# Patient Record
Sex: Female | Born: 1965 | Race: Black or African American | Hispanic: No | Smoking: Never smoker
Health system: Southern US, Community
[De-identification: ages and names within clinical notes are randomized; demographics above are authoritative.]

## PROBLEM LIST (undated history)

## (undated) HISTORY — PX: APPENDECTOMY: SHX54

## (undated) HISTORY — PX: TUBAL LIGATION: SHX77

## (undated) HISTORY — PX: RECTAL PROLAPSE REPAIR: SHX759

## (undated) HISTORY — PX: BACK SURGERY: SHX140

## (undated) HISTORY — PX: ABDOMINAL HYSTERECTOMY: SHX81

---

## 2013-05-28 ENCOUNTER — Emergency Department (HOSPITAL_BASED_OUTPATIENT_CLINIC_OR_DEPARTMENT_OTHER): Payer: Medicaid Other

## 2013-05-28 ENCOUNTER — Encounter (HOSPITAL_BASED_OUTPATIENT_CLINIC_OR_DEPARTMENT_OTHER): Payer: Self-pay | Admitting: Emergency Medicine

## 2013-05-28 ENCOUNTER — Emergency Department (HOSPITAL_BASED_OUTPATIENT_CLINIC_OR_DEPARTMENT_OTHER)
Admission: EM | Admit: 2013-05-28 | Discharge: 2013-05-29 | Disposition: A | Discharge status: Home / Self Care | Payer: Medicaid Other | Attending: Emergency Medicine | Admitting: Emergency Medicine

## 2013-05-28 DIAGNOSIS — S93409A Sprain of unspecified ligament of unspecified ankle, initial encounter: Secondary | ICD-10-CM | POA: Diagnosis not present

## 2013-05-28 DIAGNOSIS — Y92838 Other recreation area as the place of occurrence of the external cause: Secondary | ICD-10-CM | POA: Diagnosis not present

## 2013-05-28 DIAGNOSIS — S93609A Unspecified sprain of unspecified foot, initial encounter: Secondary | ICD-10-CM | POA: Diagnosis not present

## 2013-05-28 DIAGNOSIS — IMO0002 Reserved for concepts with insufficient information to code with codable children: Secondary | ICD-10-CM | POA: Insufficient documentation

## 2013-05-28 DIAGNOSIS — S93402A Sprain of unspecified ligament of left ankle, initial encounter: Secondary | ICD-10-CM

## 2013-05-28 DIAGNOSIS — Y9239 Other specified sports and athletic area as the place of occurrence of the external cause: Secondary | ICD-10-CM | POA: Diagnosis not present

## 2013-05-28 DIAGNOSIS — W010XXA Fall on same level from slipping, tripping and stumbling without subsequent striking against object, initial encounter: Secondary | ICD-10-CM | POA: Insufficient documentation

## 2013-05-28 DIAGNOSIS — S99919A Unspecified injury of unspecified ankle, initial encounter: Secondary | ICD-10-CM | POA: Diagnosis present

## 2013-05-28 DIAGNOSIS — S8990XA Unspecified injury of unspecified lower leg, initial encounter: Secondary | ICD-10-CM | POA: Diagnosis present

## 2013-05-28 DIAGNOSIS — S93602A Unspecified sprain of left foot, initial encounter: Secondary | ICD-10-CM

## 2013-05-28 DIAGNOSIS — Y9353 Activity, golf: Secondary | ICD-10-CM | POA: Diagnosis not present

## 2013-05-28 NOTE — ED Notes (Signed)
MD at bedside. 

## 2013-05-28 NOTE — ED Provider Notes (Signed)
CSN: [id number]     Arrival date & time 05/28/13  2043 History   First MD Initiated Contact with Patient 05/28/13 2307    This chart was scribed for Hanley SeamenJohn L Lavern Maslow, MD by Marica OtterNusrat Rahman, ED Scribe. This patient was seen in room MH11/MH11 and the patient's care was started at 11:08 PM.  PCP: No PCP Per Patient  Chief Complaint  Patient presents with  . Ankle Injury   HPI HPI Comments: Traci Bell is a 48 y.o. female who presents to the Emergency Department complaining of a fall tonight while playing miniature golf. Pt complains of associated pain in, and swelling of, the lateral aspect of her left foot. Pt reports that the pain is so severe that she cannot put weight on her left foot. Pt also reports a mild exacerbation of her back pain.  History reviewed. No pertinent past medical history. Past Surgical History  Procedure Laterality Date  . Appendectomy    . Abdominal hysterectomy    . Back surgery    . Tubal ligation    . Rectal prolapse repair    . Cesarean section     No family history on file. History  Substance Use Topics  . Smoking status: Never Smoker   . Smokeless tobacco: Not on file  . Alcohol Use: No   OB History   Grav Para Term Preterm Abortions TAB SAB Ect Mult Living                 Review of Systems  Musculoskeletal: Positive for back pain (mild exacerbation ).  All other systems reviewed and are negative.   Allergies  Morphine and related  Home Medications  No current outpatient prescriptions on file. Triage Vitals: BP 149/92  Pulse 85  Temp(Src) 98 F (36.7 C) (Oral)  Resp 20  Ht 5\' 8"  (1.727 m)  Wt 200 lb (90.719 kg)  BMI 30.42 kg/m2  SpO2 100%  Physical Exam  Nursing note and vitals reviewed.  General: Well-developed, well-nourished female in no acute distress; appearance consistent with age of record HENT: normocephalic; atraumatic Eyes: pupils equal, round and reactive to light; extraocular muscles intact Neck: supple Heart: regular  rate and rhythm; no murmurs, rubs or gallops Lungs: clear to auscultation bilaterally Abdomen: soft; nondistended; nontender; no masses or hepatosplenomegaly; bowel sounds present Musculoskeletal: para lumbar tenderness Extremities: No deformity; full range of motion; pulses normal; knee sleeve on the right knee; effusion of right knee. Swelling over the left lateral malleolus lateral aspect of the dorsal left foot with associated tenderness; foot is distally intact; ankle is grossly stable; no proximal fibular tenderness  Neurologic: Awake, alert and oriented; motor function intact in all extremities and symmetric; no facial droop Skin: Warm and dry Psychiatric: Normal mood and affect   ED Course  Procedures (including critical care time) DIAGNOSTIC STUDIES: Oxygen Saturation is 100% on RA, normal by my interpretation.    COORDINATION OF CARE: 11:14 PM-Discussed treatment plan which includes crutches, icing the injured area, and keeping the injured area elevated, with pt at bedside and pt agreed to plan.   MDM   Dg Ankle Complete Left  05/28/2013   CLINICAL DATA:  Foot pain status post fall hit lateral malleolus.  EXAM: LEFT ANKLE COMPLETE - 3+ VIEW  COMPARISON:  Concomitant radiograph of the left foot  FINDINGS: No acute fracture or dislocation. The ankle mortise is approximated. Mild soft tissue swelling present at the lateral malleolus. No significant joint effusion. Osseous mineralization is normal.  IMPRESSION: 1. No acute fracture or dislocation. 2. Moderate soft tissue swelling at the lateral malleolus.   Electronically Signed   By: Rise Mu M.D.   On: 05/28/2013 22:02   Dg Foot Complete Left  05/28/2013   CLINICAL DATA:  Left foot pain status post fall  EXAM: LEFT FOOT - COMPLETE 3+ VIEW  COMPARISON:  Concomitant radiograph of the left ankle  FINDINGS: No acute fracture or dislocation. The joint spaces are maintained. Soft tissue swelling present at the lateral malleolus.  Osseous mineralization is normal. No radiopaque foreign body.  IMPRESSION: No acute abnormality about the left foot.   Electronically Signed   By: Rise Mu M.D.   On: 05/28/2013 22:04   I personally performed the services described in this documentation, which was scribed in my presence. The recorded information has been reviewed and is accurate.   Hanley Seamen, MD 05/29/13 [phone]

## 2013-05-28 NOTE — ED Notes (Signed)
Left ankle and foot injury. She slipped while playing [employer] golf tonight. Swelling noted.

## 2013-05-29 MED ORDER — HYDROCODONE-ACETAMINOPHEN 5-325 MG PO TABS: 1.0000 | ORAL_TABLET | Freq: Four times a day (QID) | ORAL | Status: AC | PRN

## 2013-05-29 MED ORDER — HYDROCODONE-ACETAMINOPHEN 5-325 MG PO TABS: 1.0000 | ORAL_TABLET | Freq: Four times a day (QID) | ORAL | Status: DC | PRN

## 2014-12-17 IMAGING — CR DG FOOT COMPLETE 3+V*L*
3 series · 3 of 3 positions shown · non-contrast
Comparison: Concomitant radiograph of the left ankle

CLINICAL DATA: Left foot pain status post fall

EXAM:
LEFT FOOT - COMPLETE 3+ VIEW

[t foot lat left]
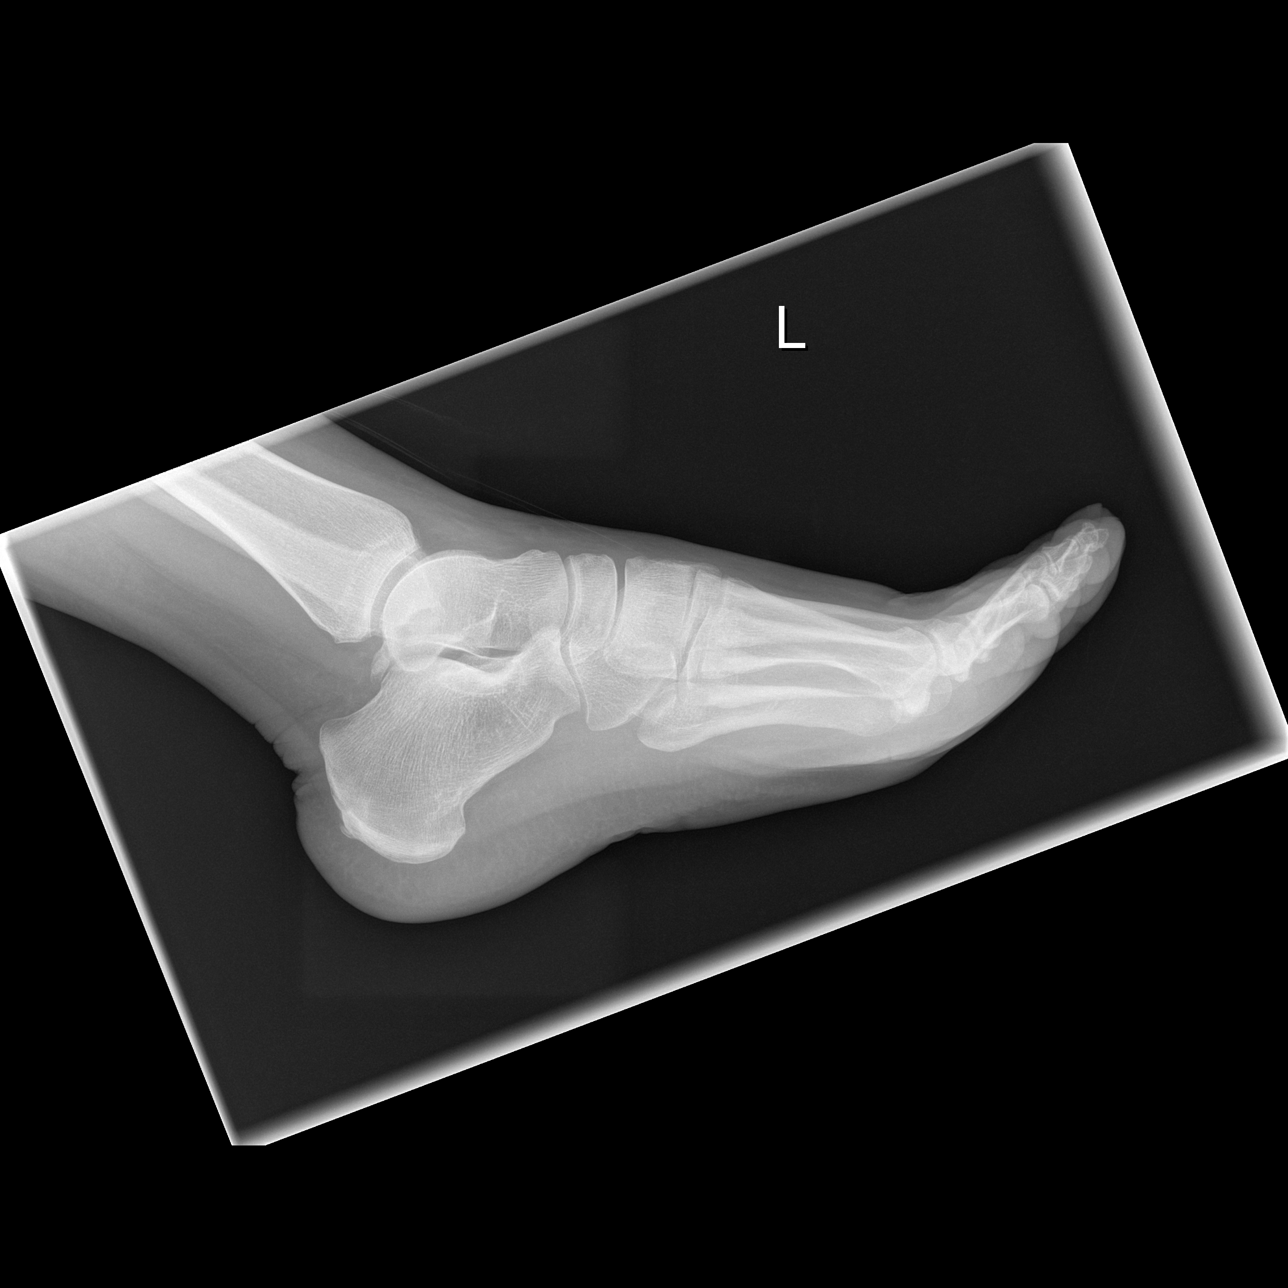

[t foot ap left]
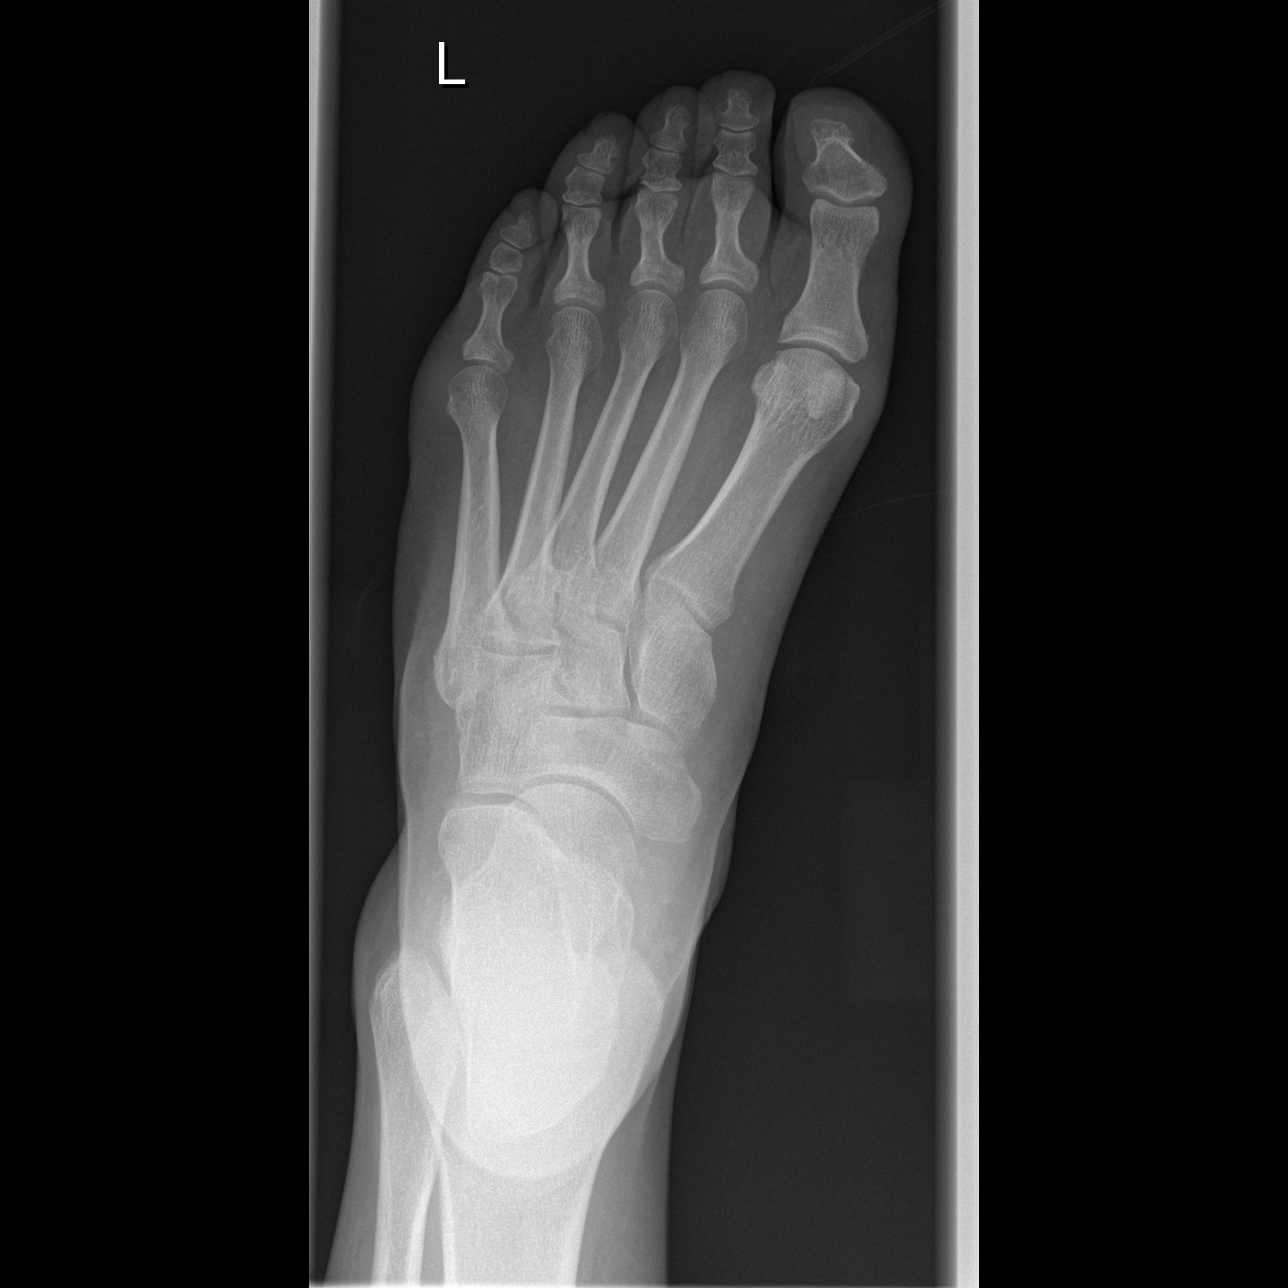

[t foot oblique left]
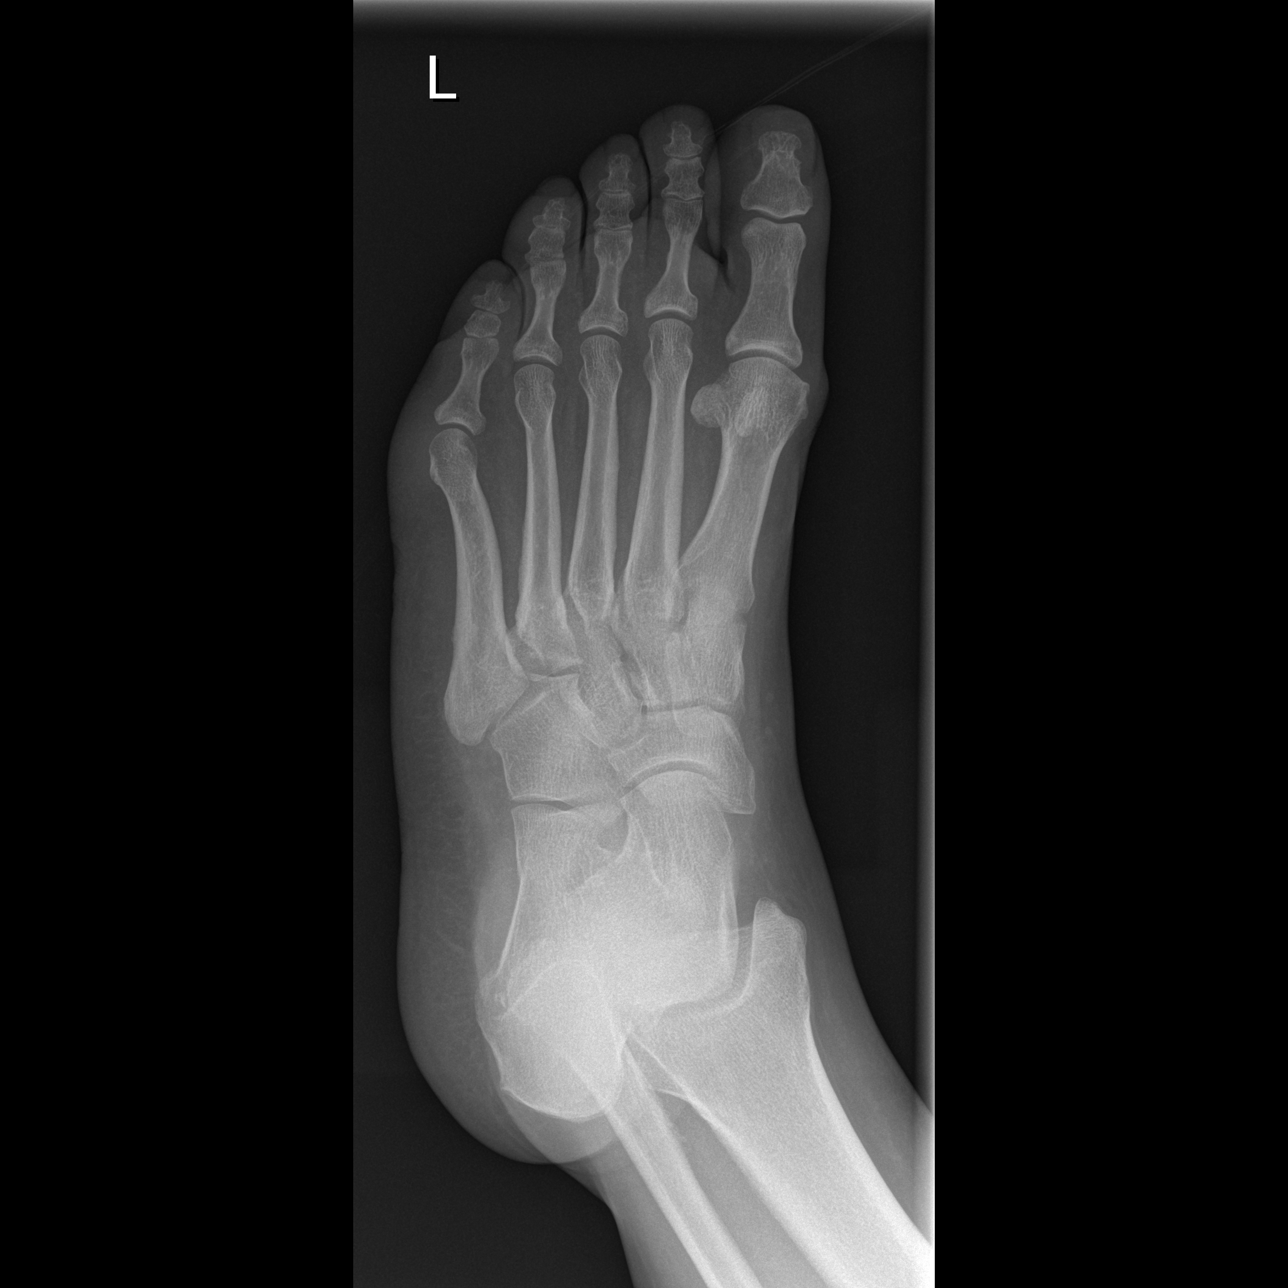

[3 of 3 positions shown; findings below may reference images not displayed]

FINDINGS: No acute fracture or dislocation. The joint spaces are maintained.
Soft tissue swelling present at the lateral malleolus. Osseous
mineralization is normal. No radiopaque foreign body.
IMPRESSION: No acute abnormality about the left foot.
